# Patient Record
Sex: Male | Born: 1975 | Race: Black or African American | Hispanic: No | Marital: Single | Smoking: Current every day smoker
Health system: Southern US, Community
[De-identification: ages and names within clinical notes are randomized; demographics above are authoritative.]

---

## 1999-04-03 ENCOUNTER — Emergency Department (HOSPITAL_COMMUNITY): Admission: EM | Admit: 1999-04-03 | Discharge: 1999-04-03 | Payer: Self-pay | Admitting: Emergency Medicine

## 1999-04-03 ENCOUNTER — Encounter: Payer: Self-pay | Admitting: Emergency Medicine

## 2003-04-06 ENCOUNTER — Encounter: Payer: Self-pay | Admitting: Emergency Medicine

## 2003-04-06 ENCOUNTER — Emergency Department (HOSPITAL_COMMUNITY): Admission: EM | Admit: 2003-04-06 | Discharge: 2003-04-06 | Payer: Self-pay | Admitting: Emergency Medicine

## 2014-03-07 ENCOUNTER — Emergency Department (HOSPITAL_COMMUNITY)
Admission: EM | Admit: 2014-03-07 | Discharge: 2014-03-07 | Disposition: A | Discharge status: Home / Self Care | Payer: Worker's Compensation | Attending: Emergency Medicine | Admitting: Emergency Medicine

## 2014-03-07 ENCOUNTER — Encounter (HOSPITAL_COMMUNITY): Payer: Self-pay | Admitting: Emergency Medicine

## 2014-03-07 DIAGNOSIS — Y9389 Activity, other specified: Secondary | ICD-10-CM | POA: Insufficient documentation

## 2014-03-07 DIAGNOSIS — IMO0002 Reserved for concepts with insufficient information to code with codable children: Secondary | ICD-10-CM | POA: Insufficient documentation

## 2014-03-07 DIAGNOSIS — T1510XA Foreign body in conjunctival sac, unspecified eye, initial encounter: Secondary | ICD-10-CM | POA: Insufficient documentation

## 2014-03-07 DIAGNOSIS — T1590XA Foreign body on external eye, part unspecified, unspecified eye, initial encounter: Secondary | ICD-10-CM

## 2014-03-07 DIAGNOSIS — S0590XA Unspecified injury of unspecified eye and orbit, initial encounter: Secondary | ICD-10-CM

## 2014-03-07 DIAGNOSIS — F17200 Nicotine dependence, unspecified, uncomplicated: Secondary | ICD-10-CM | POA: Insufficient documentation

## 2014-03-07 DIAGNOSIS — S0510XA Contusion of eyeball and orbital tissues, unspecified eye, initial encounter: Secondary | ICD-10-CM | POA: Insufficient documentation

## 2014-03-07 DIAGNOSIS — Y929 Unspecified place or not applicable: Secondary | ICD-10-CM | POA: Insufficient documentation

## 2014-03-07 MED ORDER — FLUORESCEIN SODIUM 1 MG OP STRP
1.0000 | ORAL_STRIP | Freq: Once | OPHTHALMIC | Status: AC
  Administered 2014-03-07: 1 via OPHTHALMIC
  Filled 2014-03-07: qty 1

## 2014-03-07 MED ORDER — TETRACAINE HCL 0.5 % OP SOLN
1.0000 [drp] | Freq: Once | OPHTHALMIC | Status: AC
  Administered 2014-03-07: 1 [drp] via OPHTHALMIC
  Filled 2014-03-07: qty 2
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
## 2014-03-07 NOTE — ED Notes (Signed)
Pt states his last tetanus shot "has been years"

## 2014-03-07 NOTE — Discharge Instructions (Signed)
Go directly to Dr. Alveria Apley office. [address] [place] [address] [phone]
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
## 2014-03-09 NOTE — ED Provider Notes (Signed)
Medical screening examination/treatment/procedure(s) were performed by non-physician practitioner and as supervising physician I was immediately available for consultation/collaboration.   EKG Interpretation None       Richarda Blade, MD 03/09/14 2131

## 2015-03-31 ENCOUNTER — Encounter (HOSPITAL_COMMUNITY): Payer: Self-pay | Admitting: *Deleted

## 2015-03-31 ENCOUNTER — Emergency Department (HOSPITAL_COMMUNITY)
Admission: EM | Admit: 2015-03-31 | Discharge: 2015-03-31 | Disposition: A | Discharge status: Home / Self Care | Payer: Self-pay | Attending: Emergency Medicine | Admitting: Emergency Medicine

## 2015-03-31 DIAGNOSIS — D1739 Benign lipomatous neoplasm of skin and subcutaneous tissue of other sites: Secondary | ICD-10-CM | POA: Insufficient documentation

## 2015-03-31 DIAGNOSIS — D171 Benign lipomatous neoplasm of skin and subcutaneous tissue of trunk: Secondary | ICD-10-CM

## 2015-03-31 DIAGNOSIS — Z720 Tobacco use: Secondary | ICD-10-CM | POA: Insufficient documentation

## 2015-03-31 NOTE — Discharge Instructions (Signed)
Lipoma  A lipoma is a noncancerous (benign) tumor composed of fat cells. They are usually found under the skin (subcutaneous). A lipoma may occur in any tissue of the body that contains fat. Common areas for lipomas to appear include the back, shoulders, buttocks, and thighs. Lipomas are a very common soft tissue growth. They are soft and grow slowly. Most problems caused by a lipoma depend on where it is growing.  DIAGNOSIS   A lipoma can be diagnosed with a physical exam. These tumors rarely become cancerous, but radiographic studies can help determine this for certain. Studies used may include:  · Computerized X-ray scans (CT or CAT scan).  · Computerized magnetic scans (MRI).  TREATMENT   Small lipomas that are not causing problems may be watched. If a lipoma continues to enlarge or causes problems, removal is often the best treatment. Lipomas can also be removed to improve appearance. Surgery is done to remove the fatty cells and the surrounding capsule. Most often, this is done with medicine that numbs the area (local anesthetic). The removed tissue is examined under a microscope to make sure it is not cancerous. Keep all follow-up appointments with your caregiver.  SEEK MEDICAL CARE IF:   · The lipoma becomes larger or hard.  · The lipoma becomes painful, red, or increasingly swollen. These could be signs of infection or a more serious condition.  Document Released: 11/18/2002 Document Revised: 02/20/2012 Document Reviewed: 04/30/2010  [employer]® Patient Information ©2015 [employer]. This information is not intended to replace advice given to you by your health care provider. Make sure you discuss any questions you have with your health care provider.

## 2015-03-31 NOTE — ED Notes (Addendum)
Pt states that he has a boil in on the left upper buttocks. Pt states that it has been ongoing for several years and had increased in size recently. pt denies any drainage

## 2015-03-31 NOTE — ED Provider Notes (Signed)
CSN: [id number]     Arrival date & time 03/31/15  1258 History  This chart was scribed for non-physician practitioner Montine Circle, PA-C working with Wandra Arthurs, MD by Zola Button, ED Scribe. This patient was seen in room TR02C/TR02C and the patient's care was started at 2:11 PM.      Chief Complaint  Patient presents with  . Recurrent Skin Infections   The history is provided by the patient. No language interpreter was used.    HPI Comments: Peter Franklin is a 39 y.o. male who presents to the Emergency Department complaining of bump on buttocks 1 year. States that it has been increasing in size. States that is now becoming painful. Denies any discharge or drainage. Denies any fevers, chills, nausea, vomiting, diarrhea. He has not done anything to alleviate his symptoms. There are no aggravating or relieving factors.   History reviewed. No pertinent past medical history. History reviewed. No pertinent past surgical history. No family history on file. History  Substance Use Topics  . Smoking status: Current Every Day Smoker -- 0.50 packs/day    Types: Cigarettes  . Smokeless tobacco: Never Used  . Alcohol Use: 1.8 oz/week    3 Cans of beer per week    Review of Systems  Constitutional: Negative for fever and chills.  Respiratory: Negative for shortness of breath.   Cardiovascular: Negative for chest pain.  Gastrointestinal: Negative for nausea, vomiting, diarrhea and constipation.  Genitourinary: Negative for dysuria.  All other systems reviewed and are negative.     Allergies  Review of patient's allergies indicates no known allergies.  Home Medications   Prior to Admission medications   Not on File   BP 130/92 mmHg  Pulse 96  Temp(Src) 98 F (36.7 C) (Oral)  Resp 16  Ht 5\' 8"  (1.727 m)  Wt 140 lb (63.504 kg)  BMI 21.29 kg/m2  SpO2 98% Physical Exam  Constitutional: He is oriented to person, place, and time. He appears well-developed and well-nourished. No  distress.  HENT:  Head: Normocephalic and atraumatic.  Mouth/Throat: Oropharynx is clear and moist. No oropharyngeal exudate.  Eyes: Pupils are equal, round, and reactive to light.  Neck: Neck supple.  Cardiovascular: Normal rate.   Pulmonary/Chest: Effort normal.  Musculoskeletal: He exhibits no edema.  Neurological: He is alert and oriented to person, place, and time. No cranial nerve deficit.  Skin: Skin is warm and dry. No rash noted.  Left upper buttocks remarkable for a 1 x 1 x 1 cm lipoma, no surrounding cellulitis, no evidence of abscess, discharge, or drainage  Psychiatric: He has a normal mood and affect. His behavior is normal.  Nursing note and vitals reviewed.   ED Course  Procedures  DIAGNOSTIC STUDIES: Oxygen Saturation is 98% on room air, normal by my interpretation.    COORDINATION OF CARE:    Labs Review Labs Reviewed - No data to display  Imaging Review No results found.   EKG Interpretation None      MDM   Final diagnoses:  Lipoma of buttock    Patient with lipoma of upper left buttocks. No abscess, no discharge drainage, no cellulitis. Lipoma has been present for 1 year. Will recommend general surgery follow-up. Patient understands and agrees with plan. He is stable and ready for discharge.  I personally performed the services described in this documentation, which was scribed in my presence. The recorded information has been reviewed and is accurate.     Montine Circle, PA-C 03/31/15 1428  Wandra Arthurs, MD 03/31/15 [phone]

## 2018-10-18 ENCOUNTER — Encounter (HOSPITAL_COMMUNITY): Payer: Self-pay | Admitting: Emergency Medicine

## 2018-10-18 ENCOUNTER — Emergency Department (HOSPITAL_COMMUNITY)
Admission: EM | Admit: 2018-10-18 | Discharge: 2018-10-18 | Disposition: A | Discharge status: Home / Self Care | Payer: Self-pay | Attending: Emergency Medicine | Admitting: Emergency Medicine

## 2018-10-18 ENCOUNTER — Other Ambulatory Visit: Payer: Self-pay

## 2018-10-18 DIAGNOSIS — F17210 Nicotine dependence, cigarettes, uncomplicated: Secondary | ICD-10-CM | POA: Insufficient documentation

## 2018-10-18 DIAGNOSIS — Z720 Tobacco use: Secondary | ICD-10-CM

## 2018-10-18 DIAGNOSIS — J069 Acute upper respiratory infection, unspecified: Secondary | ICD-10-CM | POA: Insufficient documentation

## 2018-10-18 MED ORDER — PREDNISONE 20 MG PO TABS: 20.0000 mg | ORAL_TABLET | Freq: Two times a day (BID) | ORAL | 0 refills | Status: AC

## 2018-10-18 MED ORDER — AEROCHAMBER PLUS FLO-VU MEDIUM MISC
1.0000 | Freq: Once | Status: AC
  Administered 2018-10-18: 1
  Filled 2018-10-18: qty 1

## 2018-10-18 MED ORDER — DOXYCYCLINE HYCLATE 100 MG PO CAPS: 100.0000 mg | ORAL_CAPSULE | Freq: Two times a day (BID) | ORAL | 0 refills | Status: AC

## 2018-10-18 MED ORDER — AEROCHAMBER PLUS FLO-VU LARGE MISC
Status: AC
  Filled 2018-10-18: qty 1

## 2018-10-18 MED ORDER — ALBUTEROL SULFATE HFA 108 (90 BASE) MCG/ACT IN AERS
2.0000 | INHALATION_SPRAY | Freq: Once | RESPIRATORY_TRACT | Status: AC
  Administered 2018-10-18: 2 via RESPIRATORY_TRACT
  Filled 2018-10-18: qty 6.7

## 2018-10-18 NOTE — ED Notes (Signed)
Patient verbalizes understanding of discharge instructions. Opportunity for questioning and answers were provided. Armband removed by staff, pt discharged from ED.  

## 2018-10-18 NOTE — ED Notes (Signed)
Got patient undress on the monitor patient is resting with nurse at bedside and family and call bell in reach

## 2018-10-18 NOTE — ED Triage Notes (Signed)
PT reports feeling weak, fatigued, having a cough, sore throat, subjective fever and feeling SOB

## 2018-10-18 NOTE — ED Provider Notes (Signed)
[facility] EMERGENCY DEPARTMENT Provider Note   CSN: [id number] Arrival date & time: 10/18/18  1431     History   Chief Complaint Chief Complaint  Patient presents with  . URI    HPI Peter Franklin is a 42 y.o. male.  HPI   He presents for evaluation of fever, chills, headache, sore throat, cough, and shortness of breath for 2 days.  He is a cigarette smoker.  He has had decreased appetite for 2 days.  He denies chronic illnesses.  He came here today because he was unable to work.  There are no other known modifying factors.  History reviewed. No pertinent past medical history.  There are no active problems to display for this patient.   History reviewed. No pertinent surgical history.      Home Medications    Prior to Admission medications   Medication Sig Start Date End Date Taking? Authorizing Provider  doxycycline (VIBRAMYCIN) 100 MG capsule Take 1 capsule (100 mg total) by mouth 2 (two) times daily. One po bid x 7 days 10/18/18   Daleen Bo, MD  predniSONE (DELTASONE) 20 MG tablet Take 1 tablet (20 mg total) by mouth 2 (two) times daily. 10/18/18   Daleen Bo, MD    Family History No family history on file.  Social History Social History   Tobacco Use  . Smoking status: Current Every Day Smoker    Packs/day: 0.50    Types: Cigarettes  . Smokeless tobacco: Never Used  Substance Use Topics  . Alcohol use: Yes    Alcohol/week: 3.0 standard drinks    Types: 3 Cans of beer per week  . Drug use: No     Allergies   Patient has no known allergies.   Review of Systems Review of Systems  All other systems reviewed and are negative.    Physical Exam Updated Vital Signs BP 120/88   Pulse 89   Temp 97.8 F (36.6 C) (Oral)   Resp 18   Wt 59 kg   SpO2 99%   BMI 19.77 kg/m   Physical Exam  Constitutional: He is oriented to person, place, and time. He appears well-developed and well-nourished. No distress.  HENT:  Head:  Normocephalic and atraumatic.  Right Ear: External ear normal.  Left Ear: External ear normal.  Eyes: Pupils are equal, round, and reactive to light. Conjunctivae and EOM are normal.  Neck: Normal range of motion and phonation normal. Neck supple.  Cardiovascular: Normal rate, regular rhythm and normal heart sounds.  Pulmonary/Chest: Effort normal and breath sounds normal. No stridor. No respiratory distress. He has no wheezes. He exhibits no bony tenderness.  Abdominal: Soft. There is no tenderness.  Musculoskeletal: Normal range of motion.  Neurological: He is alert and oriented to person, place, and time. No cranial nerve deficit or sensory deficit. He exhibits normal muscle tone. Coordination normal.  Skin: Skin is warm, dry and intact.  Psychiatric: He has a normal mood and affect. His behavior is normal. Judgment and thought content normal.  Nursing note and vitals reviewed.    ED Treatments / Results  Labs (all labs ordered are listed, but only abnormal results are displayed) Labs Reviewed - No data to display  EKG None  Radiology No results found.  Procedures Procedures (including critical care time)  Medications Ordered in ED Medications  albuterol (PROVENTIL HFA;VENTOLIN HFA) 108 (90 Base) MCG/ACT inhaler 2 puff (has no administration in time range)  AEROCHAMBER PLUS FLO-VU MEDIUM MISC 1  each (has no administration in time range)     Initial Impression / Assessment and Plan / ED Course  I have reviewed the triage vital signs and the nursing notes.  Pertinent labs & imaging results that were available during my care of the patient were reviewed by me and considered in my medical decision making (see chart for details).      Patient Vitals for the past 24 hrs:  BP Temp Temp src Pulse Resp SpO2 Weight  10/18/18 1440 120/88 97.8 F (36.6 C) Oral 89 18 99 % 59 kg  10/18/18 1439 120/88 97.8 F (36.6 C) Oral 90 20 99 % -    3:11 PM Reevaluation with update and  discussion. After initial assessment and treatment, an updated evaluation reveals he remains comfortable has no further complaints.  Findings discussed with patient and family member, all questions answered. Daleen Bo   Medical Decision Making: Valuation is consistent with the back related bronchitis.  No evidence for pneumonia, or suggestion for metabolic instability.  CRITICAL CARE-no Performed by: Daleen Bo   Nursing Notes Reviewed/ Care Coordinated Applicable Imaging Reviewed Interpretation of Laboratory Data incorporated into ED treatment  The patient appears reasonably screened and/or stabilized for discharge and I doubt any other medical condition or other [facility] requiring further screening, evaluation, or treatment in the ED at this time prior to discharge.  Plan: Home Medications-OTC as needed; Home Treatments-smoking; return here if the recommended treatment, does not improve the symptoms; Recommended follow up-to be, PRN    Final Clinical Impressions(s) / ED Diagnoses   Final diagnoses:  Upper respiratory tract infection, unspecified type  Tobacco abuse    ED Discharge Orders         Ordered    predniSONE (DELTASONE) 20 MG tablet  2 times daily     10/18/18 1517    doxycycline (VIBRAMYCIN) 100 MG capsule  2 times daily     10/18/18 1517           Daleen Bo, MD 10/18/18 1519

## 2018-10-18 NOTE — Discharge Instructions (Signed)
Try to stop smoking.  Use the inhaler 2 puffs every 3-4 hours as needed for cough or trouble breathing.  Make sure that you are getting plenty of rest, and drinking plenty of fluids.

## 2020-07-19 ENCOUNTER — Emergency Department (HOSPITAL_COMMUNITY)
Admission: EM | Admit: 2020-07-19 | Discharge: 2020-07-19 | Disposition: A | Discharge status: Home / Self Care | Payer: 59 | Attending: Emergency Medicine | Admitting: Emergency Medicine

## 2020-07-19 ENCOUNTER — Other Ambulatory Visit: Payer: Self-pay

## 2020-07-19 ENCOUNTER — Emergency Department (HOSPITAL_COMMUNITY): Payer: 59

## 2020-07-19 ENCOUNTER — Encounter (HOSPITAL_COMMUNITY): Payer: Self-pay | Admitting: Emergency Medicine

## 2020-07-19 DIAGNOSIS — Y999 Unspecified external cause status: Secondary | ICD-10-CM | POA: Diagnosis not present

## 2020-07-19 DIAGNOSIS — F17210 Nicotine dependence, cigarettes, uncomplicated: Secondary | ICD-10-CM | POA: Insufficient documentation

## 2020-07-19 DIAGNOSIS — Y939 Activity, unspecified: Secondary | ICD-10-CM | POA: Insufficient documentation

## 2020-07-19 DIAGNOSIS — Y92410 Unspecified street and highway as the place of occurrence of the external cause: Secondary | ICD-10-CM | POA: Diagnosis not present

## 2020-07-19 DIAGNOSIS — R072 Precordial pain: Secondary | ICD-10-CM | POA: Insufficient documentation

## 2020-07-19 LAB — CBC
HCT: 41.4 % (ref 39.0–52.0)
Hemoglobin: 13.2 g/dL (ref 13.0–17.0)
MCH: 28.2 pg (ref 26.0–34.0)
MCHC: 31.9 g/dL (ref 30.0–36.0)
MCV: 88.5 fL (ref 80.0–100.0)
Platelets: 345 10*3/uL (ref 150–400)
RBC: 4.68 MIL/uL (ref 4.22–5.81)
RDW: 13.2 % (ref 11.5–15.5)
WBC: 4.7 10*3/uL (ref 4.0–10.5)
nRBC: 0 % (ref 0.0–0.2)

## 2020-07-19 LAB — BASIC METABOLIC PANEL
Anion gap: 14 (ref 5–15)
BUN: 7 mg/dL (ref 6–20)
CO2: 22 mmol/L (ref 22–32)
Calcium: 9 mg/dL (ref 8.9–10.3)
Chloride: 104 mmol/L (ref 98–111)
Creatinine, Ser: 0.89 mg/dL (ref 0.61–1.24)
GFR calc Af Amer: >60 mL/min (ref >60)
GFR calc non Af Amer: >60 mL/min (ref >60)
Glucose, Bld: 125 mg/dL — ABNORMAL HIGH (ref 70–99)
Potassium: 3.7 mmol/L (ref 3.5–5.1)
Sodium: 140 mmol/L (ref 135–145)

## 2020-07-19 LAB — TROPONIN I (HIGH SENSITIVITY): Troponin I (High Sensitivity): 2 ng/L (ref <18)

## 2020-07-19 MED ORDER — DICLOFENAC SODIUM 1 % EX GEL: 2.0000 g | Freq: Four times a day (QID) | CUTANEOUS | 0 refills | Status: AC | PRN

## 2020-07-19 MED ORDER — SODIUM CHLORIDE 0.9% FLUSH: 3.0000 mL | Freq: Once | INTRAVENOUS | Status: DC

## 2020-07-19 MED ORDER — IBUPROFEN 800 MG PO TABS: 800.0000 mg | ORAL_TABLET | Freq: Three times a day (TID) | ORAL | 0 refills | Status: AC | PRN

## 2020-07-19 MED ORDER — METHOCARBAMOL 500 MG PO TABS: 500.0000 mg | ORAL_TABLET | Freq: Three times a day (TID) | ORAL | 0 refills | Status: AC | PRN
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
## 2023-11-05 ENCOUNTER — Encounter (HOSPITAL_COMMUNITY): Payer: Self-pay

## 2023-11-05 ENCOUNTER — Emergency Department (HOSPITAL_COMMUNITY)
Admission: EM | Admit: 2023-11-05 | Discharge: 2023-11-05 | Disposition: A | Discharge status: Home / Self Care | Payer: 59 | Attending: Emergency Medicine | Admitting: Emergency Medicine

## 2023-11-05 ENCOUNTER — Other Ambulatory Visit: Payer: Self-pay

## 2023-11-05 DIAGNOSIS — Z1152 Encounter for screening for COVID-19: Secondary | ICD-10-CM | POA: Insufficient documentation

## 2023-11-05 DIAGNOSIS — J069 Acute upper respiratory infection, unspecified: Secondary | ICD-10-CM

## 2023-11-05 LAB — RESP PANEL BY RT-PCR (RSV, FLU A&B, COVID)  RVPGX2
Influenza A by PCR: NEGATIVE
Influenza B by PCR: NEGATIVE
Resp Syncytial Virus by PCR: NEGATIVE
SARS Coronavirus 2 by RT PCR: NEGATIVE

## 2023-11-05 LAB — CBC
HCT: 39.6 % (ref 39.0–52.0)
Hemoglobin: 13 g/dL (ref 13.0–17.0)
MCH: 28.5 pg (ref 26.0–34.0)
MCHC: 32.8 g/dL (ref 30.0–36.0)
MCV: 86.8 fL (ref 80.0–100.0)
Platelets: 346 10*3/uL (ref 150–400)
RBC: 4.56 MIL/uL (ref 4.22–5.81)
RDW: 13.7 % (ref 11.5–15.5)
WBC: 4.4 10*3/uL (ref 4.0–10.5)
nRBC: 0 % (ref 0.0–0.2)

## 2023-11-05 LAB — BASIC METABOLIC PANEL
Anion gap: 13 (ref 5–15)
BUN: 9 mg/dL (ref 6–20)
CO2: 23 mmol/L (ref 22–32)
Calcium: 9.5 mg/dL (ref 8.9–10.3)
Chloride: 106 mmol/L (ref 98–111)
Creatinine, Ser: 0.9 mg/dL (ref 0.61–1.24)
GFR, Estimated: >60 mL/min (ref >60)
Glucose, Bld: 87 mg/dL (ref 70–99)
Potassium: 3.7 mmol/L (ref 3.5–5.1)
Sodium: 142 mmol/L (ref 135–145)

## 2023-11-05 MED ORDER — PSEUDOEPHEDRINE HCL ER 120 MG PO TB12: 120.0000 mg | ORAL_TABLET | Freq: Two times a day (BID) | ORAL | 0 refills | Status: AC

## 2023-11-05 MED ORDER — ONDANSETRON 4 MG PO TBDP
8.0000 mg | ORAL_TABLET | Freq: Once | ORAL | Status: AC
  Administered 2023-11-05: 8 mg via ORAL
  Filled 2023-11-05: qty 2

## 2023-11-05 MED ORDER — ONDANSETRON 8 MG PO TBDP: 8.0000 mg | ORAL_TABLET | Freq: Three times a day (TID) | ORAL | 0 refills | Status: AC | PRN

## 2023-11-05 MED ORDER — BENZONATATE 100 MG PO CAPS: 100.0000 mg | ORAL_CAPSULE | Freq: Three times a day (TID) | ORAL | 0 refills | Status: AC | PRN

## 2023-11-05 NOTE — ED Provider Notes (Signed)
[place] EMERGENCY DEPARTMENT AT [facility] Provider Note   CSN: [id number] Arrival date & time: 11/05/23  1050     History  Chief Complaint  Patient presents with   Emesis    Peter Franklin is a 47 y.o. male.  HPI   Patient denies any significant medical problems.  He comes to the ED with complaints of vomiting and diarrhea.  Patient states over the last week he has been having some episodes of vomiting and diarrhea while he was at work.  He has had about 2 episodes of each per day.  Patient does not have any abdominal pain.  He is felt warm at time but has not measured any fevers.  He is also had some cough and cold symptoms.  He has had some nasal congestion.  Patient has not had any vomiting today.  He has been able to drink fluids but has not eaten much.  Home Medications Prior to Admission medications   Medication Sig Start Date End Date Taking? Authorizing Provider  benzonatate (TESSALON) 100 MG capsule Take 1 capsule (100 mg total) by mouth 3 (three) times daily as needed for cough. 11/05/23  Yes Linwood Dibbles, MD  ondansetron (ZOFRAN-ODT) 8 MG disintegrating tablet Take 1 tablet (8 mg total) by mouth every 8 (eight) hours as needed for nausea or vomiting. 11/05/23  Yes Linwood Dibbles, MD  pseudoephedrine (SUDAFED SINUS CONGESTION 12HR) 120 MG 12 hr tablet Take 1 tablet (120 mg total) by mouth 2 (two) times daily. 11/05/23  Yes Linwood Dibbles, MD  diclofenac Sodium (VOLTAREN) 1 % GEL Apply 2 g topically 4 (four) times daily as needed. 07/19/20   Long, Arlyss Repress, MD  doxycycline (VIBRAMYCIN) 100 MG capsule Take 1 capsule (100 mg total) by mouth 2 (two) times daily. One po bid x 7 days 10/18/18   Mancel Bale, MD  ibuprofen (ADVIL) 800 MG tablet Take 1 tablet (800 mg total) by mouth every 8 (eight) hours as needed for moderate pain. 07/19/20   Long, Arlyss Repress, MD  methocarbamol (ROBAXIN) 500 MG tablet Take 1 tablet (500 mg total) by mouth every 8 (eight) hours as needed for muscle  spasms. 07/19/20   Long, Arlyss Repress, MD  predniSONE (DELTASONE) 20 MG tablet Take 1 tablet (20 mg total) by mouth 2 (two) times daily. 10/18/18   Mancel Bale, MD      Allergies    Patient has no known allergies.    Review of Systems   Review of Systems  Physical Exam Updated Vital Signs BP (!) 148/102   Pulse (!) 108   Temp 98.6 F (37 C)   Resp 20   Ht 1.702 m (5\' 7" )   Wt 63 kg   SpO2 99%   BMI 21.77 kg/m  Physical Exam Vitals and nursing note reviewed.  Constitutional:      General: He is not in acute distress.    Appearance: He is well-developed.  HENT:     Head: Normocephalic and atraumatic.     Right Ear: External ear normal.     Left Ear: External ear normal.     Nose: Congestion present. No rhinorrhea.  Eyes:     General: No scleral icterus.       Right eye: No discharge.        Left eye: No discharge.     Conjunctiva/sclera: Conjunctivae normal.  Neck:     Trachea: No tracheal deviation.  Cardiovascular:     Rate and Rhythm: Normal  rate and regular rhythm.  Pulmonary:     Effort: Pulmonary effort is normal. No respiratory distress.     Breath sounds: Normal breath sounds. No stridor. No wheezing or rales.  Abdominal:     General: Bowel sounds are normal. There is no distension.     Palpations: Abdomen is soft.     Tenderness: There is no abdominal tenderness. There is no guarding or rebound.  Musculoskeletal:        General: No tenderness or deformity.     Cervical back: Neck supple.  Skin:    General: Skin is warm and dry.     Findings: No rash.  Neurological:     General: No focal deficit present.     Mental Status: He is alert.     Cranial Nerves: No cranial nerve deficit, dysarthria or facial asymmetry.     Sensory: No sensory deficit.     Motor: No abnormal muscle tone or seizure activity.     Coordination: Coordination normal.  Psychiatric:        Mood and Affect: Mood normal.     ED Results / Procedures / Treatments   Labs (all labs  ordered are listed, but only abnormal results are displayed) Labs Reviewed  RESP PANEL BY RT-PCR (RSV, FLU A&B, COVID)  [id number]  CBC  BASIC METABOLIC PANEL    EKG None  Radiology No results found.  Procedures Procedures    Medications Ordered in ED Medications  ondansetron (ZOFRAN-ODT) disintegrating tablet 8 mg (8 mg Oral Given 11/05/23 1225)    ED Course/ Medical Decision Making/ A&P Clinical Course as of 11/05/23 1302  Sun Nov 05, 2023  1236 Labs reviewed CBC metabolic panel normal, COVID flu negative [JK]    Clinical Course User Index [JK] Linwood Dibbles, MD                                 Medical Decision Making Problems Addressed: Viral upper respiratory tract infection: acute illness or injury that poses a threat to life or bodily functions  Amount and/or Complexity of Data Reviewed Labs: ordered. Decision-making details documented in ED Course.  Risk OTC drugs. Prescription drug management.   Patient presented to ED for evaluation of episodes of vomiting diarrhea.  Patient is also had recent cough and congestion.  ED workup reassuring.  Patient does not have any abdominal pain.  No signs of severe dehydration.  No anemia.  Patient's COVID flu RSV is negative.  Suspect viral etiology.  He has not had any vomiting today and is tolerating p.o.  Discharge home with prescription for Sudafed, Zofran and Tessalon.  Evaluation and diagnostic testing in the emergency department does not suggest an emergent condition requiring admission or immediate intervention beyond what has been performed at this time.  The patient is safe for discharge and has been instructed to return immediately for worsening symptoms, change in symptoms or any other concerns.        Final Clinical Impression(s) / ED Diagnoses Final diagnoses:  Viral upper respiratory tract infection    Rx / DC Orders ED Discharge Orders          Ordered    ondansetron (ZOFRAN-ODT) 8 MG disintegrating  tablet  Every 8 hours PRN        11/05/23 1302    benzonatate (TESSALON) 100 MG capsule  3 times daily PRN        11/05/23 1302  pseudoephedrine (SUDAFED SINUS CONGESTION 12HR) 120 MG 12 hr tablet  2 times daily        11/05/23 1302              Linwood Dibbles, MD 11/05/23 1303

## 2023-11-05 NOTE — Discharge Instructions (Addendum)
Take the medications to help with your cough and nausea.  Follow-up with a primary care doctor to be rechecked if the symptoms do not resolve over the next week.  Return as needed for worsening symptoms

## 2023-11-05 NOTE — ED Triage Notes (Signed)
Pt c.o n/v, head cold and not being able to eat much since Monday. Denies abd pain
